# Patient Record
Sex: Female | Born: 1935
Health system: Southern US, Community
[De-identification: ages and names within clinical notes are randomized; demographics above are authoritative.]

---

## 2004-08-02 ENCOUNTER — Ambulatory Visit: Payer: Self-pay | Admitting: Psychiatry

## 2005-05-02 ENCOUNTER — Emergency Department: Payer: Self-pay | Admitting: General Practice

## 2009-07-13 ENCOUNTER — Emergency Department: Payer: Self-pay | Admitting: Internal Medicine

## 2010-05-05 ENCOUNTER — Emergency Department: Payer: Self-pay | Admitting: Emergency Medicine

## 2011-06-18 ENCOUNTER — Emergency Department: Payer: Self-pay | Admitting: Emergency Medicine

## 2011-11-01 LAB — URINALYSIS, COMPLETE
Bacteria: NONE SEEN
Glucose,UR: NEGATIVE mg/dL (ref 0–75)
Ph: 5 (ref 4.5–8.0)
RBC,UR: 4 /HPF (ref 0–5)
Squamous Epithelial: NONE SEEN

## 2011-11-01 LAB — CBC
HGB: 14.1 g/dL (ref 12.0–16.0)
MCH: 33.1 pg (ref 26.0–34.0)
MCHC: 33.7 g/dL (ref 32.0–36.0)
MCV: 98 fL (ref 80–100)
Platelet: 290 10*3/uL (ref 150–440)
RBC: 4.25 10*6/uL (ref 3.80–5.20)
RDW: 12.7 % (ref 11.5–14.5)

## 2011-11-01 LAB — COMPREHENSIVE METABOLIC PANEL
Albumin: 4.1 g/dL (ref 3.4–5.0)
Alkaline Phosphatase: 71 U/L (ref 50–136)
Anion Gap: 15 (ref 7–16)
BUN: 12 mg/dL (ref 7–18)
Calcium, Total: 8.9 mg/dL (ref 8.5–10.1)
Co2: 23 mmol/L (ref 21–32)
EGFR (African American): >60
Glucose: 105 mg/dL — ABNORMAL HIGH (ref 65–99)
Potassium: 3.6 mmol/L (ref 3.5–5.1)
SGOT(AST): 35 U/L (ref 15–37)
SGPT (ALT): 23 U/L
Total Protein: 8 g/dL (ref 6.4–8.2)

## 2011-11-01 LAB — TROPONIN I: Troponin-I: 1 ng/mL — ABNORMAL HIGH

## 2011-11-01 LAB — CK TOTAL AND CKMB (NOT AT ARMC)
CK, Total: 616 U/L — ABNORMAL HIGH (ref 21–215)
CK-MB: 6 ng/mL — ABNORMAL HIGH (ref 0.5–3.6)

## 2011-11-01 LAB — PHENYTOIN LEVEL, TOTAL: Dilantin: 3.7 ug/mL — ABNORMAL LOW (ref 10.0–20.0)

## 2011-11-02 ENCOUNTER — Inpatient Hospital Stay: Payer: Self-pay | Admitting: Internal Medicine

## 2011-11-02 ENCOUNTER — Ambulatory Visit: Payer: Self-pay | Admitting: Neurology

## 2011-11-02 DIAGNOSIS — I059 Rheumatic mitral valve disease, unspecified: Secondary | ICD-10-CM

## 2011-11-02 LAB — CK TOTAL AND CKMB (NOT AT ARMC)
CK, Total: 550 U/L — ABNORMAL HIGH (ref 21–215)
CK-MB: 3.9 ng/mL — ABNORMAL HIGH (ref 0.5–3.6)

## 2011-11-02 LAB — TROPONIN I: Troponin-I: 2 ng/mL — ABNORMAL HIGH

## 2011-11-03 LAB — CBC WITH DIFFERENTIAL/PLATELET
Basophil %: 0.5 %
Eosinophil #: 0.3 10*3/uL (ref 0.0–0.7)
Eosinophil %: 4.2 %
HCT: 36.5 % (ref 35.0–47.0)
HGB: 12 g/dL (ref 12.0–16.0)
Lymphocyte %: 32.4 %
MCH: 32.3 pg (ref 26.0–34.0)
MCHC: 32.9 g/dL (ref 32.0–36.0)
Monocyte #: 1 10*3/uL — ABNORMAL HIGH (ref 0.0–0.7)
Monocyte %: 13.2 %
Neutrophil #: 3.9 10*3/uL (ref 1.4–6.5)
Neutrophil %: 49.7 %
Platelet: 255 10*3/uL (ref 150–440)
WBC: 7.8 10*3/uL (ref 3.6–11.0)

## 2011-11-03 LAB — BASIC METABOLIC PANEL
Calcium, Total: 8.5 mg/dL (ref 8.5–10.1)
Chloride: 110 mmol/L — ABNORMAL HIGH (ref 98–107)
Creatinine: 0.64 mg/dL (ref 0.60–1.30)
EGFR (African American): >60
Glucose: 81 mg/dL (ref 65–99)
Osmolality: 285 (ref 275–301)
Potassium: 3.5 mmol/L (ref 3.5–5.1)
Sodium: 144 mmol/L (ref 136–145)

## 2011-11-03 LAB — LIPID PANEL
Cholesterol: 163 mg/dL (ref 0–200)
HDL Cholesterol: 65 mg/dL — ABNORMAL HIGH (ref 40–60)
Triglycerides: 87 mg/dL (ref 0–200)
VLDL Cholesterol, Calc: 17 mg/dL (ref 5–40)

## 2011-11-03 LAB — HEMOGLOBIN A1C: Hemoglobin A1C: 5.7 % (ref 4.2–6.3)

## 2011-11-04 LAB — URINALYSIS, COMPLETE
Bacteria: NONE SEEN
Bilirubin,UR: NEGATIVE
Glucose,UR: NEGATIVE mg/dL (ref 0–75)
Nitrite: NEGATIVE
Protein: NEGATIVE
RBC,UR: 1 /HPF (ref 0–5)
Specific Gravity: 1.008 (ref 1.003–1.030)
Squamous Epithelial: <1
WBC UR: 1 /HPF (ref 0–5)

## 2011-11-04 LAB — TROPONIN I: Troponin-I: 0.45 ng/mL — ABNORMAL HIGH

## 2011-11-06 LAB — CBC WITH DIFFERENTIAL/PLATELET
Basophil %: 0.5 %
Eosinophil %: 5.6 %
HCT: 36.7 % (ref 35.0–47.0)
HGB: 12.2 g/dL (ref 12.0–16.0)
Lymphocyte #: 2.6 10*3/uL (ref 1.0–3.6)
Lymphocyte %: 38.6 %
MCHC: 33.2 g/dL (ref 32.0–36.0)
MCV: 100 fL (ref 80–100)
Monocyte %: 13.8 %
Neutrophil #: 2.8 10*3/uL (ref 1.4–6.5)
RDW: 12.6 % (ref 11.5–14.5)
WBC: 6.8 10*3/uL (ref 3.6–11.0)

## 2011-11-06 LAB — BASIC METABOLIC PANEL
Calcium, Total: 8 mg/dL — ABNORMAL LOW (ref 8.5–10.1)
Chloride: 108 mmol/L — ABNORMAL HIGH (ref 98–107)
Creatinine: 0.59 mg/dL — ABNORMAL LOW (ref 0.60–1.30)
EGFR (Non-African Amer.): >60
Glucose: 81 mg/dL (ref 65–99)
Osmolality: 288 (ref 275–301)
Potassium: 3 mmol/L — ABNORMAL LOW (ref 3.5–5.1)
Sodium: 145 mmol/L (ref 136–145)

## 2012-04-16 ENCOUNTER — Ambulatory Visit: Payer: Self-pay | Admitting: Internal Medicine

## 2012-10-29 ENCOUNTER — Emergency Department: Payer: Self-pay | Admitting: Emergency Medicine

## 2013-05-20 ENCOUNTER — Ambulatory Visit: Payer: Self-pay | Admitting: Internal Medicine

## 2013-05-28 ENCOUNTER — Inpatient Hospital Stay: Payer: Self-pay | Admitting: Internal Medicine

## 2013-05-28 LAB — COMPREHENSIVE METABOLIC PANEL
Alkaline Phosphatase: 98 U/L (ref 50–136)
Anion Gap: 3 — ABNORMAL LOW (ref 7–16)
BUN: 12 mg/dL (ref 7–18)
Bilirubin,Total: 0.4 mg/dL (ref 0.2–1.0)
Co2: 32 mmol/L (ref 21–32)
Creatinine: 0.64 mg/dL (ref 0.60–1.30)
EGFR (Non-African Amer.): >60
Osmolality: 273 (ref 275–301)
Potassium: 3.7 mmol/L (ref 3.5–5.1)
SGOT(AST): 18 U/L (ref 15–37)
SGPT (ALT): 14 U/L (ref 12–78)
Total Protein: 7.8 g/dL (ref 6.4–8.2)

## 2013-05-28 LAB — URINALYSIS, COMPLETE
Bacteria: NONE SEEN
Glucose,UR: NEGATIVE mg/dL (ref 0–75)
Ketone: NEGATIVE
Ph: 6 (ref 4.5–8.0)
RBC,UR: 6 /HPF (ref 0–5)
Squamous Epithelial: 1
WBC UR: 3 /HPF (ref 0–5)

## 2013-05-28 LAB — CBC
HCT: 37.2 % (ref 35.0–47.0)
HGB: 13 g/dL (ref 12.0–16.0)
MCH: 32.6 pg (ref 26.0–34.0)
MCHC: 34.9 g/dL (ref 32.0–36.0)
MCV: 93 fL (ref 80–100)
Platelet: 298 10*3/uL (ref 150–440)
WBC: 9.1 10*3/uL (ref 3.6–11.0)

## 2013-05-28 LAB — PRO B NATRIURETIC PEPTIDE: B-Type Natriuretic Peptide: 1074 pg/mL — ABNORMAL HIGH (ref 0–450)

## 2013-05-28 LAB — CK TOTAL AND CKMB (NOT AT ARMC): CK-MB: <0.5 ng/mL — ABNORMAL LOW (ref 0.5–3.6)

## 2013-05-29 LAB — CBC WITH DIFFERENTIAL/PLATELET
Basophil #: 0 10*3/uL (ref 0.0–0.1)
Basophil %: 0.3 %
Eosinophil #: 0.2 10*3/uL (ref 0.0–0.7)
Eosinophil %: 1.4 %
HGB: 11.6 g/dL — ABNORMAL LOW (ref 12.0–16.0)
Lymphocyte #: 2.8 10*3/uL (ref 1.0–3.6)
Lymphocyte %: 18.7 %
MCH: 32.5 pg (ref 26.0–34.0)
Monocyte #: 1.7 x10 3/mm — ABNORMAL HIGH (ref 0.2–0.9)
Neutrophil %: 67.9 %
Platelet: 284 10*3/uL (ref 150–440)
WBC: 14.8 10*3/uL — ABNORMAL HIGH (ref 3.6–11.0)

## 2013-05-29 LAB — BASIC METABOLIC PANEL
Anion Gap: 4 — ABNORMAL LOW (ref 7–16)
BUN: 16 mg/dL (ref 7–18)
Calcium, Total: 8.6 mg/dL (ref 8.5–10.1)
Co2: 30 mmol/L (ref 21–32)
Creatinine: 0.64 mg/dL (ref 0.60–1.30)
EGFR (African American): >60
EGFR (Non-African Amer.): >60
Glucose: 91 mg/dL (ref 65–99)
Osmolality: 271 (ref 275–301)

## 2013-05-29 LAB — PROTIME-INR: Prothrombin Time: 15.7 secs — ABNORMAL HIGH (ref 11.5–14.7)

## 2013-05-29 LAB — MAGNESIUM: Magnesium: 1.6 mg/dL — ABNORMAL LOW

## 2013-06-02 LAB — CULTURE, BLOOD (SINGLE)

## 2013-06-04 ENCOUNTER — Inpatient Hospital Stay: Payer: Self-pay | Admitting: Internal Medicine

## 2013-06-04 LAB — CK TOTAL AND CKMB (NOT AT ARMC)
CK, Total: 74 U/L (ref 21–215)
CK-MB: 1.7 ng/mL (ref 0.5–3.6)

## 2013-06-04 LAB — CBC
HGB: 12 g/dL (ref 12.0–16.0)
MCH: 32.4 pg (ref 26.0–34.0)
MCHC: 34.8 g/dL (ref 32.0–36.0)
RDW: 12.6 % (ref 11.5–14.5)
WBC: 9.8 10*3/uL (ref 3.6–11.0)

## 2013-06-04 LAB — COMPREHENSIVE METABOLIC PANEL
Alkaline Phosphatase: 102 U/L (ref 50–136)
Anion Gap: 6 — ABNORMAL LOW (ref 7–16)
Bilirubin,Total: 0.2 mg/dL (ref 0.2–1.0)
Co2: 28 mmol/L (ref 21–32)
EGFR (African American): >60
Glucose: 85 mg/dL (ref 65–99)
Osmolality: 268 (ref 275–301)
Potassium: 3.9 mmol/L (ref 3.5–5.1)
SGPT (ALT): 18 U/L (ref 12–78)
Sodium: 134 mmol/L — ABNORMAL LOW (ref 136–145)
Total Protein: 7.6 g/dL (ref 6.4–8.2)

## 2013-06-04 LAB — PHENYTOIN LEVEL, TOTAL: Dilantin: 8.4 ug/mL — ABNORMAL LOW (ref 10.0–20.0)

## 2013-06-04 LAB — PRO B NATRIURETIC PEPTIDE: B-Type Natriuretic Peptide: 882 pg/mL — ABNORMAL HIGH (ref 0–450)

## 2013-06-04 LAB — TSH: Thyroid Stimulating Horm: 2.3 u[IU]/mL

## 2013-06-05 LAB — BASIC METABOLIC PANEL
Anion Gap: 5 — ABNORMAL LOW (ref 7–16)
BUN: 10 mg/dL (ref 7–18)
Chloride: 101 mmol/L (ref 98–107)
Creatinine: 0.72 mg/dL (ref 0.60–1.30)
EGFR (Non-African Amer.): >60
Glucose: 90 mg/dL (ref 65–99)
Osmolality: 269 (ref 275–301)
Potassium: 4 mmol/L (ref 3.5–5.1)
Sodium: 135 mmol/L — ABNORMAL LOW (ref 136–145)

## 2013-06-05 LAB — CBC WITH DIFFERENTIAL/PLATELET
Basophil %: 0.5 %
Eosinophil %: 0.5 %
HCT: 32.8 % — ABNORMAL LOW (ref 35.0–47.0)
HGB: 11.3 g/dL — ABNORMAL LOW (ref 12.0–16.0)
Lymphocyte #: 1.6 10*3/uL (ref 1.0–3.6)
MCH: 31.8 pg (ref 26.0–34.0)
Neutrophil #: 5.5 10*3/uL (ref 1.4–6.5)
Neutrophil %: 71.3 %
Platelet: 385 10*3/uL (ref 150–440)
RBC: 3.54 10*6/uL — ABNORMAL LOW (ref 3.80–5.20)
RDW: 12.6 % (ref 11.5–14.5)

## 2013-06-09 LAB — CULTURE, BLOOD (SINGLE)

## 2013-06-20 ENCOUNTER — Ambulatory Visit: Payer: Self-pay | Admitting: Internal Medicine

## 2013-11-24 ENCOUNTER — Inpatient Hospital Stay: Payer: Self-pay | Admitting: Internal Medicine

## 2013-11-24 LAB — URINALYSIS, COMPLETE
BILIRUBIN, UR: NEGATIVE
Bacteria: NONE SEEN
Glucose,UR: NEGATIVE mg/dL (ref 0–75)
Ketone: NEGATIVE
LEUKOCYTE ESTERASE: NEGATIVE
Nitrite: NEGATIVE
PH: 7 (ref 4.5–8.0)
Protein: NEGATIVE
RBC,UR: 2 /HPF (ref 0–5)
Specific Gravity: 1.013 (ref 1.003–1.030)
Squamous Epithelial: <1
WBC UR: NONE SEEN /HPF (ref 0–5)

## 2013-11-24 LAB — COMPREHENSIVE METABOLIC PANEL
ALBUMIN: 3 g/dL — AB (ref 3.4–5.0)
ANION GAP: 8 (ref 7–16)
Alkaline Phosphatase: 77 U/L
BUN: 11 mg/dL (ref 7–18)
Bilirubin,Total: 0.9 mg/dL (ref 0.2–1.0)
CALCIUM: 8.5 mg/dL (ref 8.5–10.1)
CHLORIDE: 101 mmol/L (ref 98–107)
CO2: 21 mmol/L (ref 21–32)
Creatinine: 0.78 mg/dL (ref 0.60–1.30)
EGFR (African American): >60
GLUCOSE: 83 mg/dL (ref 65–99)
OSMOLALITY: 259 (ref 275–301)
Potassium: 3.7 mmol/L (ref 3.5–5.1)
SGOT(AST): 23 U/L (ref 15–37)
SGPT (ALT): 11 U/L — ABNORMAL LOW (ref 12–78)
Sodium: 130 mmol/L — ABNORMAL LOW (ref 136–145)
Total Protein: 7.5 g/dL (ref 6.4–8.2)

## 2013-11-24 LAB — TROPONIN I: Troponin-I: <0.02 ng/mL

## 2013-11-24 LAB — CBC
HCT: 41.4 % (ref 35.0–47.0)
HGB: 13.5 g/dL (ref 12.0–16.0)
MCH: 31.2 pg (ref 26.0–34.0)
MCHC: 32.7 g/dL (ref 32.0–36.0)
MCV: 96 fL (ref 80–100)
Platelet: 276 10*3/uL (ref 150–440)
RBC: 4.33 10*6/uL (ref 3.80–5.20)
RDW: 13.6 % (ref 11.5–14.5)
WBC: 9.3 10*3/uL (ref 3.6–11.0)

## 2013-11-25 ENCOUNTER — Ambulatory Visit: Payer: Self-pay | Admitting: Internal Medicine

## 2013-11-25 LAB — CBC WITH DIFFERENTIAL/PLATELET
BASOS ABS: 0 10*3/uL (ref 0.0–0.1)
Basophil %: 0.1 %
Eosinophil #: 0 10*3/uL (ref 0.0–0.7)
Eosinophil %: 0 %
HCT: 35.3 % (ref 35.0–47.0)
HGB: 11.8 g/dL — ABNORMAL LOW (ref 12.0–16.0)
LYMPHS ABS: 0.9 10*3/uL — AB (ref 1.0–3.6)
Lymphocyte %: 4.6 %
MCH: 32.2 pg (ref 26.0–34.0)
MCHC: 33.5 g/dL (ref 32.0–36.0)
MCV: 96 fL (ref 80–100)
MONOS PCT: 5.1 %
Monocyte #: 1 x10 3/mm — ABNORMAL HIGH (ref 0.2–0.9)
NEUTROS PCT: 90.2 %
Neutrophil #: 17 10*3/uL — ABNORMAL HIGH (ref 1.4–6.5)
Platelet: 265 10*3/uL (ref 150–440)
RBC: 3.67 10*6/uL — AB (ref 3.80–5.20)
RDW: 13.5 % (ref 11.5–14.5)
WBC: 18.9 10*3/uL — ABNORMAL HIGH (ref 3.6–11.0)

## 2013-11-25 LAB — COMPREHENSIVE METABOLIC PANEL
ALBUMIN: 2.4 g/dL — AB (ref 3.4–5.0)
AST: 46 U/L — AB (ref 15–37)
Alkaline Phosphatase: 57 U/L
Anion Gap: 6 — ABNORMAL LOW (ref 7–16)
BUN: 10 mg/dL (ref 7–18)
Bilirubin,Total: 0.9 mg/dL (ref 0.2–1.0)
CALCIUM: 7.7 mg/dL — AB (ref 8.5–10.1)
CREATININE: 0.79 mg/dL (ref 0.60–1.30)
Chloride: 109 mmol/L — ABNORMAL HIGH (ref 98–107)
Co2: 25 mmol/L (ref 21–32)
EGFR (Non-African Amer.): >60
Glucose: 72 mg/dL (ref 65–99)
OSMOLALITY: 277 (ref 275–301)
POTASSIUM: 4 mmol/L (ref 3.5–5.1)
SGPT (ALT): 14 U/L (ref 12–78)
Sodium: 140 mmol/L (ref 136–145)
Total Protein: 6.1 g/dL — ABNORMAL LOW (ref 6.4–8.2)

## 2013-11-25 LAB — MAGNESIUM: MAGNESIUM: 1.7 mg/dL — AB

## 2013-11-25 LAB — TSH: THYROID STIMULATING HORM: 0.063 u[IU]/mL — AB

## 2013-11-25 LAB — T4, FREE: Free Thyroxine: 1.37 ng/dL (ref 0.76–1.46)

## 2013-11-25 LAB — PHENYTOIN LEVEL, TOTAL: DILANTIN: 1.5 ug/mL — AB (ref 10.0–20.0)

## 2013-11-26 LAB — CBC WITH DIFFERENTIAL/PLATELET
BASOS ABS: 0.1 10*3/uL (ref 0.0–0.1)
Basophil %: 0.3 %
Eosinophil #: 0 10*3/uL (ref 0.0–0.7)
Eosinophil %: 0 %
HCT: 35.8 % (ref 35.0–47.0)
HGB: 12 g/dL (ref 12.0–16.0)
LYMPHS ABS: 0.6 10*3/uL — AB (ref 1.0–3.6)
LYMPHS PCT: 2.6 %
MCH: 32.3 pg (ref 26.0–34.0)
MCHC: 33.5 g/dL (ref 32.0–36.0)
MCV: 96 fL (ref 80–100)
MONOS PCT: 2.9 %
Monocyte #: 0.7 x10 3/mm (ref 0.2–0.9)
NEUTROS ABS: 23.8 10*3/uL — AB (ref 1.4–6.5)
Neutrophil %: 94.2 %
Platelet: 293 10*3/uL (ref 150–440)
RBC: 3.72 10*6/uL — AB (ref 3.80–5.20)
RDW: 13.8 % (ref 11.5–14.5)
WBC: 25.3 10*3/uL — AB (ref 3.6–11.0)

## 2013-11-26 LAB — BASIC METABOLIC PANEL
Anion Gap: 4 — ABNORMAL LOW (ref 7–16)
BUN: 14 mg/dL (ref 7–18)
CALCIUM: 8 mg/dL — AB (ref 8.5–10.1)
CHLORIDE: 110 mmol/L — AB (ref 98–107)
CREATININE: 0.87 mg/dL (ref 0.60–1.30)
Co2: 25 mmol/L (ref 21–32)
GFR CALC NON AF AMER: 58 — AB
Glucose: 144 mg/dL — ABNORMAL HIGH (ref 65–99)
Osmolality: 281 (ref 275–301)
POTASSIUM: 3.6 mmol/L (ref 3.5–5.1)
Sodium: 139 mmol/L (ref 136–145)

## 2013-11-26 LAB — URINE CULTURE

## 2013-11-27 LAB — CBC WITH DIFFERENTIAL/PLATELET
BASOS ABS: 0.1 10*3/uL (ref 0.0–0.1)
BASOS PCT: 0.3 %
EOS ABS: 0 10*3/uL (ref 0.0–0.7)
Eosinophil %: 0 %
HCT: 36.7 % (ref 35.0–47.0)
HGB: 11.9 g/dL — AB (ref 12.0–16.0)
LYMPHS PCT: 3 %
Lymphocyte #: 0.5 10*3/uL — ABNORMAL LOW (ref 1.0–3.6)
MCH: 31.1 pg (ref 26.0–34.0)
MCHC: 32.5 g/dL (ref 32.0–36.0)
MCV: 96 fL (ref 80–100)
MONO ABS: 0.6 x10 3/mm (ref 0.2–0.9)
Monocyte %: 3.4 %
NEUTROS PCT: 93.3 %
Neutrophil #: 16.8 10*3/uL — ABNORMAL HIGH (ref 1.4–6.5)
Platelet: 291 10*3/uL (ref 150–440)
RBC: 3.83 10*6/uL (ref 3.80–5.20)
RDW: 14.3 % (ref 11.5–14.5)
WBC: 18 10*3/uL — ABNORMAL HIGH (ref 3.6–11.0)

## 2013-11-27 LAB — COMPREHENSIVE METABOLIC PANEL
Albumin: 2 g/dL — ABNORMAL LOW (ref 3.4–5.0)
Alkaline Phosphatase: 61 U/L
Anion Gap: 6 — ABNORMAL LOW (ref 7–16)
BUN: 16 mg/dL (ref 7–18)
Bilirubin,Total: 0.3 mg/dL (ref 0.2–1.0)
CHLORIDE: 113 mmol/L — AB (ref 98–107)
Calcium, Total: 8 mg/dL — ABNORMAL LOW (ref 8.5–10.1)
Co2: 24 mmol/L (ref 21–32)
Creatinine: 0.84 mg/dL (ref 0.60–1.30)
EGFR (Non-African Amer.): >60
Glucose: 94 mg/dL (ref 65–99)
Osmolality: 286 (ref 275–301)
Potassium: 3.6 mmol/L (ref 3.5–5.1)
SGOT(AST): 24 U/L (ref 15–37)
SGPT (ALT): 10 U/L — ABNORMAL LOW (ref 12–78)
Sodium: 143 mmol/L (ref 136–145)
Total Protein: 6.2 g/dL — ABNORMAL LOW (ref 6.4–8.2)

## 2013-11-28 LAB — BASIC METABOLIC PANEL
Anion Gap: 9 (ref 7–16)
BUN: 21 mg/dL — AB (ref 7–18)
CHLORIDE: 115 mmol/L — AB (ref 98–107)
Calcium, Total: 8.6 mg/dL (ref 8.5–10.1)
Co2: 22 mmol/L (ref 21–32)
Creatinine: 0.68 mg/dL (ref 0.60–1.30)
EGFR (African American): >60
EGFR (Non-African Amer.): >60
Glucose: 69 mg/dL (ref 65–99)
Osmolality: 292 (ref 275–301)
Potassium: 3.7 mmol/L (ref 3.5–5.1)
Sodium: 146 mmol/L — ABNORMAL HIGH (ref 136–145)

## 2013-11-28 LAB — CBC WITH DIFFERENTIAL/PLATELET
BASOS PCT: 0.2 %
Basophil #: 0 10*3/uL (ref 0.0–0.1)
Eosinophil #: 0 10*3/uL (ref 0.0–0.7)
Eosinophil %: 0.1 %
HCT: 34.8 % — ABNORMAL LOW (ref 35.0–47.0)
HGB: 11.2 g/dL — ABNORMAL LOW (ref 12.0–16.0)
Lymphocyte #: 0.8 10*3/uL — ABNORMAL LOW (ref 1.0–3.6)
Lymphocyte %: 6.1 %
MCH: 30.9 pg (ref 26.0–34.0)
MCHC: 32.1 g/dL (ref 32.0–36.0)
MCV: 96 fL (ref 80–100)
MONO ABS: 0.7 x10 3/mm (ref 0.2–0.9)
MONOS PCT: 5.3 %
NEUTROS PCT: 88.3 %
Neutrophil #: 11.7 10*3/uL — ABNORMAL HIGH (ref 1.4–6.5)
PLATELETS: 286 10*3/uL (ref 150–440)
RBC: 3.62 10*6/uL — ABNORMAL LOW (ref 3.80–5.20)
RDW: 14.3 % (ref 11.5–14.5)
WBC: 13.2 10*3/uL — ABNORMAL HIGH (ref 3.6–11.0)

## 2013-11-29 LAB — CULTURE, BLOOD (SINGLE)

## 2013-12-18 ENCOUNTER — Ambulatory Visit: Payer: Self-pay | Admitting: Internal Medicine

## 2013-12-18 DEATH — deceased

## 2014-10-05 IMAGING — CR DG ABDOMEN 1V
1 series · 1 of 1 positions shown · non-contrast
Comparison: CT ANGIO ABD-PELV WO/W CM dated 11/24/2013; DG PELVIS
1-2V dated 10/29/2012

CLINICAL DATA: Abdominal distention concern of an ileus

EXAM:
ABDOMEN - 1 VIEW

[supine kub]
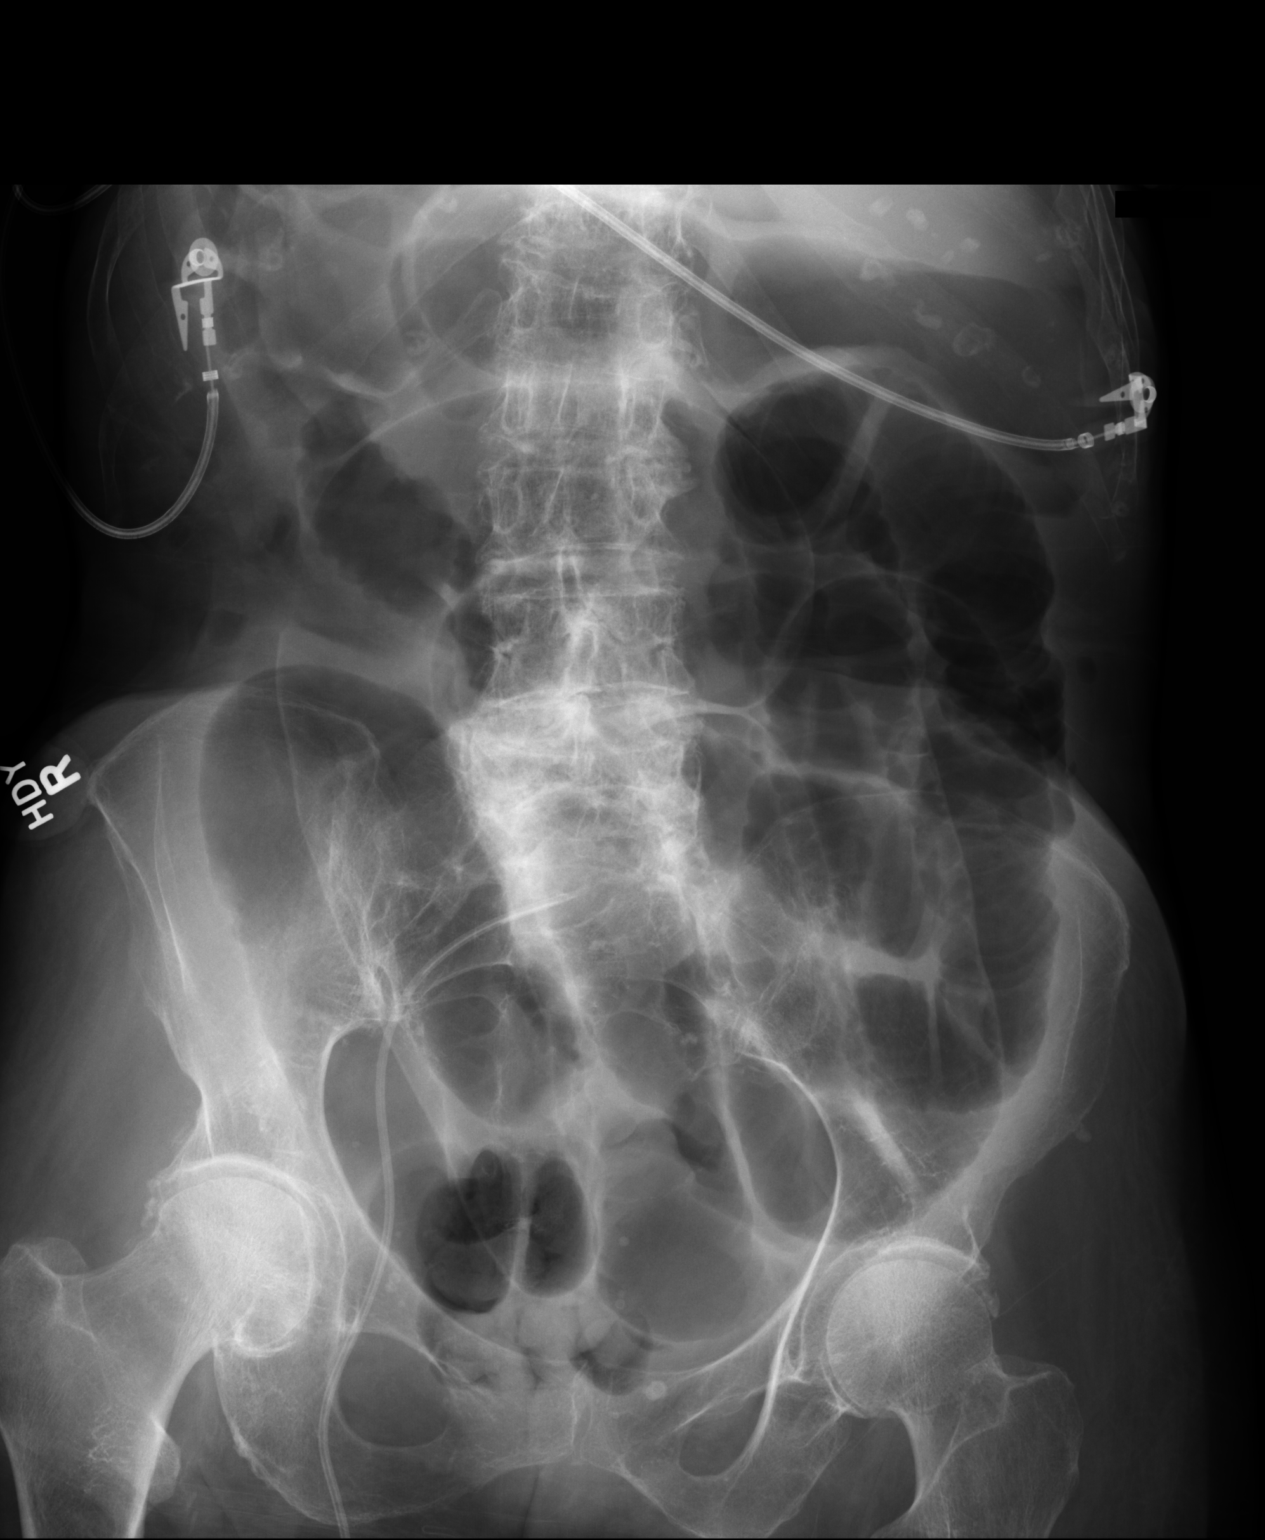

[1 of 1 positions shown; findings below may reference images not displayed]

FINDINGS: Air is seen. Distended loops of large small bowel. There findings
concerning for the presence of a Riggler's sign within loops of
small bowel in the central abdomen and pelvis. Further evaluation
with and upright abdomen view or lateral decubitus is recommended.
Multilevel degenerative changes appreciated within lumbar spine.
Degenerative changes appreciated within the right left hips. A
catheter projects within the lower pelvis extending to the lower
central abdomen likely representing a femoral vein central venous
catheter.
IMPRESSION: Findings in the central and lower abdomen raising concern of
possible free air. Further evaluation with upright imaging and/or
left lateral decubitus view is recommended as well as clinical
correlation. Distended air filled loops of large and small bowel
which may reflect an ileus. These results were called by telephone
at the time of interpretation on 11/26/2013 at [DATE] to Dr.
TAT TIGER , who verbally acknowledged these results.

## 2015-02-09 NOTE — Discharge Summary (Signed)
PATIENT NAME:  Michelle Kidd, Michelle Kidd MR#:  [id number] DATE OF BIRTH:  [birth year]  DATE OF ADMISSION:  05/28/2013 DATE OF DISCHARGE:  05/30/2013   DISCHARGE DIAGNOSES: 1.  Active on chronic respiratory failure secondary to pneumonia.  2.  Chronic obstructive pulmonary disease exacerbation. 3.  Seizures.  4.  Acute on chronic diastolic heart failure.   DISCHARGE MEDICATIONS: 1.  Metoprolol 25 mg half tablet by mouth twice daily.  2.  Levothyroxine  112mcg by mouth daily in the morning.  3.  Furosemide 20 mg by mouth daily.  4.  Tramadol 50 mg by mouth every 4 to 6 hours as needed for pain.  5.  KCl 10 mEq by mouth daily.  6.  Phenytoin 200 mg by mouth every 12 hours.  7.  Amoxicillin 500 mg by mouth 3 times daily.  8.  Azithromycin, a 3 day course, 500 mg by mouth daily.  9.  Amoxicillin is given for 10 days for pneumonia.   OXYGEN:  2 liters of oxygen all the time.   DIET:  Low sodium diet.   CONSULTATIONS:  None.   HOSPITAL COURSE:  A 81 year old female patient with history of COPD, seizure disorder, history of hypothyroidism, hypertension came in because of shortness of breath, cough and productive phlegm.  The patient uses 2 to 3 liters of oxygen, but in the ER the patient had to be started on 4.5 liters of oxygen.  The patient desaturated when she was speaking and the patient admitted to hospitalist service for COPD flare and the patient was started on Rocephin and Zithromax and chest x-ray on admission showed suggestive of acute interstitial infiltrate or edema.  The patient does not have any pedal edema.  Initial white count was 9.1.  The following day the white count went up to 14.8.  The patient's blood cultures have been negative so we treated her for pneumonia because of her symptoms and the patient also says she has some hot and cold feeling at home, so we gave her Rocephin and Zithromax initially and sent her home with Amoxicillin and Zithromax.  The patient was started on Lasix  for possible CHF, but had an EF of 60% to 65% and normal LV function so we resumed the home dose of Lasix only.   The patient did not want to stay home or seen by physical therapy.  She wanted to go home and the patient lives [facility].  She does have home oxygen and she is followed by hospice and she did not express any needs that she really wanted to go home.   Time spent on discharge preparation more than 30 minutes.  Oxygen saturation at the time of discharge on 2 liters 92%.  Other vitals are stable.    ____________________________ Katha HammingSnehalatha Millianna Szymborski, MD sk:ea D: 06/01/2013 16:15:04 ET T: 06/02/2013 00:36:19 ET JOB#: [id number]  cc: Katha HammingSnehalatha Jiali Linney, MD, <Dictator> Katha HammingSNEHALATHA Eunice Oldaker MD ELECTRONICALLY SIGNED 06/07/2013 16:55

## 2015-02-09 NOTE — Discharge Summary (Signed)
PATIENT NAME:  Michelle Kidd, Michelle Kidd MR#:  [id number] DATE OF BIRTH:  01/17/1920  DATE OF ADMISSION:  06/04/2013 DATE OF DISCHARGE:  06/06/2013  DISCHARGE DIAGNOSES: 1.  Acute chronic obstructive pulmonary disease exacerbation.  2.  Chronic respiratory failure  3.  Chronic diastolic congestive heart failure.   ADMITTING HISTORY AND PHYSICAL: Please see detailed H and P dictated previously by Dr. Allena KatzPatel. In brief, a 81 year old Caucasian female patient with history of chronic respiratory failure, chronic obstructive pulmonary disease return to the Emergency Room complaining of worsening shortness of breath. The patient was found to have wheezing with chronic obstructive pulmonary disease exacerbation and admitted to the hospitalist service.   HOSPITAL COURSE:   Chronic obstructive pulmonary disease exacerbation. The patient did have increased requirements of oxygen up to 5 liters initially. She had acute wheezing, started on nebulizers, steroids and antibiotics, with which she improved well during the next 48 hours of hospital stay. The patient's wheezing, has decreased. She is back to 2 liters of oxygen which is her baseline, has requested to be discharged home. I have discussed with the case manager and home health has been set up, as the patient is homebound. The patient is followed by hospice at home.   On the day of discharge, the patient does not have any wheezing on exam. She is on 2 liters oxygen.   DISCHARGE MEDICATIONS: Include:  1.  Metoprolol tartrate 25 mg half tablet oral 2 times a day.  2.  Levothyroxine mcg oral once a day.  3.  Lasix 20 mg oral once a day.  4.  Tramadol 50 mg oral every six hours as needed for pain.  5.  Potassium chloride 10 mEq oral once a day.  6.  Phenytoin 200 mg oral 2 times a day.  7.  DuoNeb 3ml  inhaled every four hours as needed.  8.  Prednisone 60 mg tapered over six days.  9.  Levaquin 250 mg oral once a day.  10.  Advair Diskus 150, one puff inhaled  2 times a day.  11.  Ativan 0.5 mg oral once a day as needed for anxiety.   DISCHARGE INSTRUCTIONS: Regular diet of regular consistency. Activity as tolerated with assistance. Home health has been set up with a nurse and nurse's aide. Follow up with primary care physician in 1 to 2 weeks.   TIME SPENT ON DAY OF DISCHARGE IN DISCHARGE ACTIVITY:  35 minutes.  ____________________________ Molinda BailiffSrikar R. Renay Crammer, MD srs:cc D: 06/07/2013 15:32:50 ET T: 06/07/2013 20:47:41 ET JOB#: [id number]  cc: Wardell HeathSrikar R. Gari Trovato, MD, <Dictator> Reola MosherAndrew S. Randa LynnLamb, MD Orie FishermanSRIKAR R Toria Monte MD ELECTRONICALLY SIGNED 06/10/2013 0:45

## 2015-02-09 NOTE — H&P (Signed)
PATIENT NAME:  Michelle Kidd, Michelle Kidd MR#:  [id number] DATE OF BIRTH:  [birth year]  DATE OF ADMISSION:  06/04/2013  PRIMARY CARE PHYSICIAN:  Dr. Mickey Farberavid Thies  ED REFERRING PHYSICIAN:  Dr. Enedina FinnerGoli    CHIEF COMPLAINT: Shortness of breath.   HISTORY OF PRESENT ILLNESS: The patient is a 81 year old female who is a resident of a [facility] independent living facility, who was actually hospitalized on 08/09 and was discharged three days later, who presents back with shortness of breath. At that time, she presented with shortness of breath. She does have chronic respiratory failure and she is chronically 3.5 liters of oxygen at home. She was admitted with possible pneumonia COPD flare. At that time, she also had echo which showed a normal EF and did have some findings consistent with pulmonary hypertension. The patient was discharged with oral antibiotics, comes back with worsening shortness of breath. Noted to be hypoxic. Her chest x-ray shows chronic fibrotic changes which are unchanged. The patient denies any fevers. She has been having cough which is chronic. She also complains of chest pain with the coughing. She denies any fevers or chills. No abdominal pain, nausea, vomiting or diarrhea. The patient is a poor historian.   PAST MEDICAL HISTORY: 1.  Chronic respiratory failure requiring chronic O2.  2.  Chronic obstructive pulmonary disease.  3.  History of seizure disorder.  4.  History of hypertension.  5.  Hypothyroidism.   PAST SURGICAL HISTORY: None.   ALLERGIES: None.   MEDICATIONS: From the recent discharge: Metoprolol 25 1/2 tab p.o. b.i.d., levothyroxine 112 mcg daily, Lasix 20 daily, tramadol 50 q.4 to 6 p.r.n. for pain, KCl 10 mEq daily,  phenytoin 200 q.12, amoxicillin 500 mg 1 tab p.o. t.i.d., azithromycin three day course.   ALLERGIES: None.   SOCIAL HISTORY: Resides at [facility].  Negative for tobacco or alcohol or illicit drug. The patient is DNR, wheelchair-bound.    FAMILY HISTORY: Significant for seizure disorder.   REVIEW OF SYSTEMS: CONSTITUTIONAL: Denies fevers. Complains of fatigue, weakness. No pain. No weight loss. No weight gain.  EYES: No blurred or double vision.  ENT: No tinnitus. No ear pain. Does have some hearing loss. No difficulty swallowing.  RESPIRATORY: Complains of cough. No wheezing. No hemoptysis. Has chronic dyspnea. Has COPD. CARDIOVASCULAR: Complains of chest pain with deep breathing. No orthopnea. No edema. No arrhythmia.  GASTROINTESTINAL: No nausea, vomiting, diarrhea. No abdominal pain. No hematemesis. No melena.  GENITOURINARY: Denies any dysuria, hematuria, renal calculus or frequency.  ENDOCRINE: Denies any polyuria or nocturia. Does have hypothyroidism.  HEMATOLOGIC/LYMPHATIC:  Denies any major bruisability or bleeding.  SKIN: No acne. No rash. No changes in mole, hair or skin.  MUSCULOSKELETAL: Denies any pain in the neck, back or shoulder.  NEUROLOGIC: No numbness. No CVA. No TIA. No seizures.  PSYCHIATRIC: No anxiety. No insomnia. No ADD.   PHYSICAL EXAMINATION: VITAL SIGNS: Temperature 98.4, pulse 64, respirations 30, blood pressure 139/55, 02 94%.  GENERAL: The patient is a very frail-looking Caucasian female, is dyspneic.  EYES: Pupils equally round, reactive to light and accommodation. There is no conjunctival pallor. No scleral icterus. Extraocular movements intact.  HEENT: Head normocephalic, atraumatic. Nose: There is no nasal lesions, no drainage. Ears: There is no drainage or external lesions. Mouth: No exudates, no lesions. NECK: Supple and symmetric. No masses. Thyroid midline. No JVD.  RESPIRATORY: She has some accessory muscle usage with bilateral crackles and rhonchi throughout both lungs.  CARDIOVASCULAR: Regular rate and rhythm. No  murmurs, gallops, clicks or heaves.  ABDOMEN: Soft, nontender, nondistended. Positive bowel sounds x 4.   GENITOURINARY: Deferred.  MUSCULOSKELETAL: There is no  erythema or swelling.  SKIN: No rash.  LYMPHATICS: No lymph nodes palpable. VASCULAR: Good DP, PT pulses.  NEUROLOGICAL: Cranial nerves II through XII grossly intact. Strength 5 out of 5 in all 4 extremities.  PSYCHIATRIC: A little anxious from her breathing difficulties.   EVALUATIONS: Chest x-ray shows bilateral interstitial changes likely representing chronic interstitial lung disease. Her blood work is currently pending.   ASSESSMENT AND PLAN: The patient is 81 year old white female with history of chronic lung disease, likely pulmonary fibrosis and chronic obstructive pulmonary disease represents   with shortness of breath.   1.  Acute on chronic respiratory failure: Likely due to possible chronic obstructive pulmonary disease flare, possible progressive interstitial lung disease. The patient currently does not appear volume overloaded. Had a recent echo with pulmonary hypertension with preserved ejection fraction noted during recent hospitalization. At this time, we will treat her with nebulizers, steroids and IV antibiotics. Her prognosis is very poor in light of progressive lung disease. I will ask palliative care team to come assist the patient. The patient may benefit from possible hospice services.  2.  Hypothyroidism. Continue Synthroid.  3.  History of seizure disorder, and there was some question whether the patient may have had a seizure. I will check  Dilantin level. We will continue Dilantin as taking at home.  4.  CODE STATUS: DO NOT RESUSCITATE. As stated above, we will get palliative care team to come evaluate the patient   Note:  50 minutes spent on this patient.    ____________________________ Lacie Scotts. Allena Katz, MD shp:nts D: 06/04/2013 20:45:02 ET T: 06/04/2013 22:44:25 ET JOB#: [id number]  cc: Elis Sauber H. Allena Katz, MD, <Dictator> Charise Carwin MD ELECTRONICALLY SIGNED 06/06/2013 16:27

## 2015-02-09 NOTE — H&P (Signed)
PATIENT NAME:  Michelle Kidd, Tawnie M MR#:  [id number] DATE OF BIRTH:  01-08-1920  DATE OF ADMISSION:  05/28/2013  PRIMARY CARE PHYSICIAN: ?.   CODE STATUS: DNR.   CHIEF COMPLAINT: Shortness of breath and weakness.   HISTORY OF PRESENT ILLNESS: This is a 81 year old female, a resident of [facility] independent living facility who was sent in because she has been feeling weak and shortness of breath. Per patient, she has not been eating for awhile, probably around a little over a month. She has been getting progressively weak. She feels as though she "can't go on any more." She has chronic respiratory failure, on 3.5 liters of oxygen. Here in the ER, she is on 4.5 liters of oxygen. She desaturates when she speaks. The patient states she has been coughing, which has been productive of sputum, darkish in color, which she thought was her sinuses. She does not report any significant wheezing. There is some report of some diarrhea. No evidence of bleeding. No evidence of blood in her productive sputum. She states she has had chills, but no significant fever. She was eventually just sent in to the ER. History provided by the patient and her caretaker. The patient is a poor to fair historian. She does appear weak.   REVIEW OF SYSTEMS: As best able to, all 10-point systems reviewed and negative except as noted in HPI.   PAST MEDICAL HISTORY: Includes chronic respiratory failure, COPD, seizure disorder,  history of hypertension and hypothyroidism.    PAST SURGICAL HISTORY: None.   MEDICATIONS: 1. Lasix 20 mg daily.  2. Synthroid 0.112 mg daily.  3. Metoprolol 12.5 mg p.o. b.i.d.  4. Dilantin 200 mg p.o. q.12 hours.  5. Tramadol 50 mg q.6 hours p.r.n.  ALLERGIES: No known drug allergies.   SOCIAL HISTORY: Resides at [facility]. Negative tobacco, alcohol or illicit drugs. The patient is on home oxygen. She is DNR. She is mostly wheelchair-bound, but she does do some limited ambulation with a  4-prong push walker.   FAMILY HISTORY: Significant for epilepsy.   PHYSICAL EXAMINATION:  VITAL SIGNS: Blood pressure 126/62, pulse 66, respirations 22, temperature 97.8, satting 99% on 4 liters nasal cannula.  GENERAL: Alert and oriented female, weak.  EYES: Pink conjunctivae. PERRLA.  ENT: Somewhat dry oral mucosa. Trachea midline.  NECK: Supple.  CARDIOVASCULAR: Regular rate and rhythm without murmurs, regurgitation or gallops. No JVD.  LUNGS: No wheeze appreciated. Poor air exchange. Shallow respirations. No use of accessory muscles.  ABDOMEN: Soft, positive bowel sounds. Nontender, nondistended. No organomegaly.  NEUROLOGIC: Cranial nerves II through XII grossly intact. Sensation intact.  MUSCULOSKELETAL: The patient is globally weakened by age and debility, chronic. Strength to approximately 4 to 5 in bilateral lower extremities, 5 out of 5 in upper extremities.  SKIN: No rashes. No subcutaneous crepitation.   LABORATORY DATA: UA. Nitrite negative, leukocyte esterase trace, white blood cells 3 and bacteria not seen. Sodium 137, potassium 3.7, chloride 102, CO2 32, BUN 12, creatinine 0.6, glucose 86, calcium 9.1. White blood count 9.1, hemoglobin 13, platelets 298.   IMAGING: Chest x-ray: Findings suggest acute interstitial infiltrate or edema superimposed on chronic fibrotic changes in both lungs. There is no alveolar pneumonia or significant pleural fluid collection.   ASSESSMENT AND PLAN:  1. Shortness of breath.  2. Chronic respiratory failure. I suspect the patient has some mild fluid overload. At this point, I am not highly suspicious of pneumonia. I will go ahead and order a BNP on the patient  and order some IV Lasix for the patient. The patient did receive antibiotics in the ER. Blood cultures have additionally been ordered. Given her history of chronic respiratory failure and chronic obstructive pulmonary disease, I will go ahead and continue the antibiotics until BNP is resulted  for additional information. I will also order a repeat chest x-ray for the a.m. Continue the patient's home oxygen. Nebulizers have been ordered as needed with respiratory to assess. Oxygen to keep saturations greater than 88% has been ordered.  3. History of seizure disorder.  4. Hypertension. Continue home medications.  5. I will request physical therapy to see the patient.  6. Tessalon Perles p.r.n. cough.   Based on the patient's symptoms, the patient may likely need to be admitted greater than 48 hours.  ____________________________ Gery Pray, MD dc:OSi D: 05/28/2013 13:42:23 ET T: 05/28/2013 13:59:54 ET JOB#: [id number]  cc: Gery Pray, MD, <Dictator> Gery Pray MD ELECTRONICALLY SIGNED 05/29/2013 17:47

## 2015-02-10 NOTE — H&P (Signed)
PATIENT NAME:  Michelle Kidd, Michelle Kidd MR#:  [id number] DATE OF BIRTH:  [birth year]  DATE OF ADMISSION:  11/24/2013  REFERRING PHYSICIAN: Lowella FairyJohn Woodruff, MD   PRIMARY CARE PHYSICIAN: Alonna BucklerAndrew Lamb, MD  CHIEF COMPLAINT: Abdominal pain.   HISTORY OF PRESENT ILLNESS: This is a very nice 81 year old female, who is a poor historian, who comes today with a history of abdominal pain that has been going on for a week. The patient states that she has not been eating well. She is having regular bowel movements and she is urinating good amounts but she has been feeling weak, dizzy, and lightheaded.   The patient lives by herself. She has a live-in relative, which is apparently a nephew, which we have not been able to contact. She came via EMS and actually she started to have diarrhea this morning, but prior to today, she was having regular bowel movements.   At this time, she states the abdominal pain is really bad. She has been having some pain radiating into the left lower back and now it is all over her belly. She is at home with 2 liters of oxygen due to her chronic respiratory failure.   CODE STATUS: LIMITED CODE. SHE WOULD LIKE TO HAVE PRESSORS BUT SHE WOULD LIKE TO BE INTUBATED OR TO HAVE ANY RESUSCITATION IF HER HEART STOPS.   The patient is admitted for treatment of peritonitis as the CT scan shows significant edema of the pretibial space, and on top of that, her blood pressure initially was 60s over 50s and gets as low as 37/29, significantly tachycardic although she is not febrile, and her white count is normal.   Surgery has seen the patient. She is not a surgical candidate at this moment. They recommended medical treatment.   REVIEW OF SYSTEMS: A 12-system review of systems is done. Difficult to obtain due to the patient's difficulty communicating.  CONSTITUTIONAL: She states she has been fatigued, weak. No fevers.  EYES: No blurry vision, double vision.  ENT: No difficulty swallowing. No tinnitus.   RESPIRATORY: Chronic shortness of breath, chronic oxygen-dependent, chronic cough. No sputum.  CARDIOVASCULAR: No chest pain, orthopnea, or syncope.  GASTROINTESTINAL: No nausea or vomiting. Positive abdominal pain. Positive diarrhea today but prior to today, she has been having normal bowel movements. No hematuria or melena.  GENITOURINARY: No dysuria or hematuria.  ENDOCRINE: No polyuria, polydipsia, polyphagia.  SKIN: No rashes or petechiae. The patient looks very pale.  MUSCULOSKELETAL: No significant joint pain. No gout.  NEUROLOGIC: No numbness, tingling. No CVAs.  PSYCHIATRIC: No agitation. The patient is alert. She is oriented only to person and place but not time.   PAST MEDICAL HISTORY:  1.  Chronic respiratory failure, 2 L of oxygen at home.  2.  COPD.  3.  History of seizure disorder.  4.  Hypertension.  5.  Hypothyroidism.   PAST SURGICAL HISTORY: None.   ALLERGIES: No known drug allergies.   SOCIAL HISTORY: The [place] in a [place] home in [place]. She does not smoke, does not drink. SHE IS A DNR/DNI BUT WANTS TO BE KEPT ALIVE. She is wheelchair-bound.   FAMILY HISTORY: Seizure disorder.   CURRENT MEDICATIONS: Prednisone 10 mg once a day, phenytoin 200 mg every 12 hours, levothyroxine 112 mg once a day, Levaquin 500 mg every 24 hours, furosemide 20 mg 0.5 tablets once a day, Ativan 0.5 mg twice daily.   PHYSICAL EXAMINATION:  VITAL SIGNS: Blood pressure as low as 37/29 at some point. Right now  86/46 and dopamine changed to norepinephrine. Heart rate in between 100 and 120, respiratory rate in between 18 to 30, temperature 98.4, oxygen saturation 93% on 2 L.  GENERAL: Alert and she is oriented x2. Mild distress due to abdominal pain.  HEENT: Pupils are equal and reactive. Extraocular movements are intact. Mucosae are dry. Anicteric sclerae. Pink conjunctivae. No oral lesions. No oropharyngeal exudates.  NECK: Supple. No JVD. No  adenopathy. No carotid bruits.   CARDIOVASCULAR: Regular rate and rhythm, tachycardic. No murmurs, rubs or gallops. No displacement of PMI. No tenderness to palpation anterior chest wall.  LUNGS: Clear without any wheezing or crepitus. No use of accessory muscles.  GENITAL: Negative for external lesions.  ABDOMEN: Slightly distended. Very tender to palpation. Positive rebound. Positive guarding. Significant peritoneal signs. Not surgical candidate. No hepatosplenomegaly.  EXTREMITIES: No edema, cyanosis or clubbing. Pulses +2. Capillary refill less than 3.  SKIN: No rashes or petechiae. No new rashes.  NEUROLOGIC: Cranial nerves II through XII intact. Strength is 4/5 in 4 extremities.  PSYCHIATRIC: Calm, cooperative, very pleasant not agitated.  MUSCULOSKELETAL: No joint effusions or joint swelling.  LYMPHATICS: Negative for in the neck or supraclavicular areas.   DIAGNOSTIC RESULTS: Glucose 83, creatinine 0.78, sodium 130. Other electrolytes within normal limits. Albumin 3. LFTs within normal limits. White count is 9.3, hemoglobin is 13, platelet count 276. Red blood cells 2 in urine. No white blood cells in urine. No signs of infection in the urine.   CT of the abdomen shows stenosis at the origin of the celiac trunk and the stenosis involving the right renal artery. Free fluid on the abdomen and pelvis. Peritoneal thickening and enhancement. Wall thickening at the level of the sigmoid colon. Diverticulitis cannot be excluded. Extensive vascular dz  throughout the abdominal mesentery. Cannot exclude mesenteric edema.   ASSESSMENT AND PLAN: A 81 year old female with severe septic shock and peritonitis.  1.  Septic shock. The patient has very low blood pressures. We are going to continue IV fluids, resuscitation, continue pressor support. At this moment central line has been placed. The patient is going to be sent to the critical care unit. The patient is going to be covered with Zosyn for gastrointestinal  flora and anaerobes.   2.  The patient also is getting blood cultures and a steroid replacement. The patient takes prednisone 10 mg once a day due to her respiratory problems for which we are going to cover with stress steroids, at Solu-Cortef at 50 mg IV q.6 hours.  3.  The patient is high risk of death for cardiovascular arrest, cardiovascular collapse. The patient is not an operatory candidate but definitely she has a source of sepsis intra-abdominal with peritoneal signs. Possibilities of diverticulitis versus mesenteric ischemia since the patient has severe artery sclerosis at the level of the mesenteric arteries.  4.  Chronic respiratory failure. Continue O2. 5.  Chronic obstructive pulmonary disease. At this moment there are no signs of exacerbation. The patient has been taking prednisone. Continue oxygen support and evaluation.  6.  Hypertension. Hold blood pressure medications.  7.  Hypothyroidism. Replace thyroid hormone IV.  8.  Seizure disorder. The patient is not having seizures at this moment. We are going to change her Dilantin to fosphenytoin IV. 9.  Deep vein thrombosis prophylaxis with heparin. Gastrointestinal prophylaxis with Protonix.   The patient is at critical care time. We are going to ask for palliative care to see her.   TOTAL CRITICAL CARE TIME SPENT: I spent  about 60 minutes with this patient without counting for central line placement.    ____________________________ Felipa Furnace, MD rsg:np D: 11/24/2013 18:55:33 ET T: 11/24/2013 19:17:19 ET JOB#: [id number]  cc: Felipa Furnace, MD, <Dictator> Deyvi Bonanno Juanda Chance MD ELECTRONICALLY SIGNED 12/05/2013 23:30

## 2015-02-10 NOTE — Consult Note (Signed)
Brief Consult Note: Diagnosis: septic shock, severe ileus.   Patient was seen by consultant.   Discussed with Attending MD.   Comments: Reviewed XRays - no evidence of perforation. She has developed a severe small bowel and colonic ileus, all the way to the rectum, and she has a solid stool ball there. Abdomen is moderate-severely distended, and although superficially tender, she has no rebound tenderness or guarding. There is nothing surgery can offer her. She lives alone and has no family, but she can speak for herself. I suggest a mostly NPO status (very limited sips of noncarbonated bevs), and conside enemas and dulcolax tabs. Hopefully her severe hypotension didn't lead to ischemic bowel, but if it did, again, surgery would go against her wishes and goals of care, as she would never achieve extubation afterwards. Also, I do not believe further imaging studies will be beneficial, at least not now.  Electronic Signatures: Claude MangesMarterre, Iline Buchinger F (MD)  (Signed 07-Feb-15 15:43)  Authored: Brief Consult Note   Last Updated: 07-Feb-15 15:43 by Claude MangesMarterre, Apryl Brymer F (MD)

## 2015-02-10 NOTE — Discharge Summary (Signed)
PATIENT NAME:  Michelle Kidd, Michelle Kidd MR#:  [id number] DATE OF BIRTH:  October 01, 1920  DATE OF ADMISSION:  11/24/2013 DATE OF DISCHARGE:  11/29/2013  The patient was discharged to hospice home.   DISCHARGE DIAGNOSES: 1.  Severe malnutrition.   2.  Bowel obstruction.  3.  Peritonitis.    CODE STATUS ON DISCHARGE:  DNR.   MEDICATIONS ON DISCHARGE: 1.  Dulcolax suppository 10 mg once a day.  2.  Morphine 0.5 mL oral every 1 to 2 hours.  3.  Zofran 4 mg oral tablet, the patient is getting every six hours as needed for nausea and vomiting.   HISTORY OF PRESENTING ILLNESS:   51.  A 81 year old female with poor historian, came to hospital with abdominal pain going on for a week.  She was not eating well, having regular bowel movements, was urinating a good amount.  She had been feeling weak and dizzy and lightheaded.  She had some pain radiating into her lower back.  At home was on 2 liters oxygen due to chronic respiratory failure.  CT scan of the abdomen showed significant edema of pretibial space.  Blood pressure was in 60s and 50s on admission, with tachycardia.  She was found having possible peritonitis.  A surgery consult was called in, but she was not a surgical patient.  Initially she was admitted to Critical Care Unit with septic shock on Levophed drip slowly tapered off.  We tried laxatives to relieve her constipation and on repeat x-ray was also found having possible questionable perforation, was on empiric Zosyn, maintained nothing by mouth in hospital.  Gradually because of not oral improvement and severe malnutrition she was accepted by hospice home for further management of her care.  2.  Seizure disorder.  We continued Dilantin.  Later on when we put her on oral clear liquid and medication we started her on Dilantin oral.  3.  Chronic respiratory failure secondary to COPD which was stable and we continued oxygen in the hospital.  4.  Hypothyroidism.  TSH was low.  We continued Synthroid.  5.   Leukocytosis which was due to IV steroid due to septic shock.  Gradually it improved.   CONSULTATION IN THE HOSPITAL:  Surgical consult, Dr. Duwaine MaxinWilliam Marterre.   LABORATORY DATA:  Creatinine 0.78, sodium 130, potassium 3.7 on admission.  White cell count 9.3, hemoglobin 13.5, platelet count 276.  Urine culture was negative.  CT scan showed some stenosis with arteries.  Free fluid within abdomen.  Peritoneal thickening.  Blood cultures negative.  White cell count was 18.9.  White cell count went up to 25.3.  X-ray KUB showed small bowel dilatation.   Total time spent in this discharge 35 minutes.   ____________________________ Hope PigeonVaibhavkumar G. Elisabeth PigeonVachhani, MD vgv:ea D: 12-16-13 01:27:18 ET T: 12-16-13 02:45:35 ET JOB#: [id number]  cc: Hope PigeonVaibhavkumar G. Elisabeth PigeonVachhani, MD, <Dictator> Altamese DillingVAIBHAVKUMAR Joven Mom MD ELECTRONICALLY SIGNED 12/10/2013 8:42

## 2015-02-11 NOTE — Consult Note (Signed)
Referring Physician:  Tyrone Schimke   Primary Care Physician:  Shepp[facility] Penton, [address] [place], [place] [address], [phone]  Reason for Consult:  Admit Date: 02-Nov-2011   Chief Complaint: confusion   Reason for Consult: seizure   History of Present Illness:  History of Present Illness:   [name] is an 81 year-old female with a past medical history significant for epilepsy (on dilantin) who was hospitalized on the hospitalist service on 11/02/2011 for confusion, with concern for possible seizure and acute NSTEMI. per ED notes and the admitting H&P, it was ntoed that the patient presented to the ED with confusion and possibly may have had a seizure prior to presentation. The patient reports that she recently saw her PCP, Dr. Arline Asp, who has been managing her seizure medications,and at the time, it was noted that her phenytoin levels were high, so she was told to stop taking her dilantin for a few days and her daily maintenance dose was decreased. The patient doesn't quite recall her daily dose of dilantin, though ED flow sheet indicated that she is currently on 100 mg three times daily (previously had been at 400 mg daily). The patient herself denies that she had any seizures yesterday. She states her last seizure was "a long time ago" though she cannot specify how long ago. She states that she had some chest pain with pain radiating down her left arm, and so she called her aide who came and took her to the hospital. The patient denies that she has any sore muscles today, nor did she bite her tongue or have any incontinence.    ROS:   General: pain    HEENT vision problems    Lungs: no complaints    Cardiac: chest pain    GI: no complaints    GU: no complaints    Musculoskeletal: joint pain  joint swelling    Extremities: no complaints    Skin: no complaints    Neuro: headache    Endocrine: no complaints    Psych: no complaints    Past Medical/Surgical Hx:  epilepsy:   Past Medical/ Surgical Hx:   Past Medical History epilepsy - started in her teens, has been on dilantin and reports that she has not had a seizure in "a long time"    Past Surgical History None   Home Medications:  Additional Home Medications: On Dilantin at home, unclear what the exact dosage is, though ED intake sheet indicates that she had been on dilantin 400 mg daily and this was recently decreased to dilantin 100 mg three times daily.   [facility] Current Meds:  Sodium Chloride 0.9%, 1000 ml at 75 ml/hr  Sodium Chloride 0.9% injection, 2 ml, IV push, q6h  Acetaminophen * tablet, ( Tylenol (325 mg) tablet)  650 mg Oral q4h PRN for pain or temp. greater than 100.4  - Indication: Pain/Fever  Enoxaparin injection, ( Lovenox injection )  40 mg, Subcutaneous, daily  Indication: Prophylaxis or treatment of thromboembolic disorders, Monitor Anticoags per hospital protocol  Ondansetron disintegrating tablet, ( Zofran ODT)  4 mg Oral q6h PRN for nausea, vomiting  - Indication: Nausea/ Vomiting  Pantoprazole tablet, 40 mg Oral q6am  - Indication: Erosive Esophagitis/ GERD  Instructions:  DO NOT CRUSH  Phenytoin Sodium ER capsule,  ( Dilantin)  100 mg Oral q8h  - Indication: Seizures/Status Epilepticus/Neuritic Pain/Venticular Dysrhythmias  LORazepam injection,  ( Ativan injection )  2 mg,  IV push, q2h PRN for seizure  Indication: Anxiety/ Seizure/ Antiemetic Adjunct/ Preop Sedation  Aspirin Enteric Coated tablet, ( Ecotrin)  325 mg Oral daily  - Indication: Pain/Fever/Thromboembolic Disorders/Post MI/Prophylaxis MI  Instructions:  Initiate Bleeding Precautions Protocol--DO NOT CRUSH  Metoprolol tartrate tablet, ( Lopressor)  12.5 mg Oral q12h  - Indication: Antihypertensive/ Angina  Pneumococcal 23-valent Vaccine, 0.5 ml, Intramuscular, atdischarge  Indication: Pneumococcal Immunization, 0.5ml IM once (Stored in Pyxis  Refrigerator)  Fosphenytoin injection, ( CereBYX injection )  1000 mg/pe in Dextrose 5% 100 ml, IV Piggyback, once, Infuse over 10 minute(s)  Indication: Seizures  Allergies:  Allergy Status Unknown:   Allergies:   Allergies NKDA   Social/Family History:  Lives With: alone   Social History: Patient reports she lives by herself. An aide comes to check up on her daily. She denies any tobacco use, alcohol use, or illicit drug use.   Family History: No family history of seizures.   Vital Signs: **Vital Signs.:   13-Jan-13 04:47   Vital Signs Type Admission   Temperature Temperature (F) 99.2   Celsius 37.3   Temperature Source oral   Pulse Pulse 75   Pulse source per Dinamap   Respirations Respirations 18   Systolic BP Systolic BP 104   Diastolic BP (mmHg) Diastolic BP (mmHg) 54   Mean BP 70   BP Source Dinamap   Pulse Ox % Pulse Ox % 93   Pulse Ox Activity Level  At rest   Oxygen Delivery Room Air/ 21 %    08:10   Vital Signs Type Routine   Temperature Temperature (F) 97.6   Celsius 36.4   Temperature Source tympanic   Pulse Pulse 58   Pulse source per Dinamap   Respirations Respirations 18   Systolic BP Systolic BP 93   Diastolic BP (mmHg) Diastolic BP (mmHg) 55   Mean BP 67   BP Source Dinamap   Pulse Ox % Pulse Ox % 93   Pulse Ox Activity Level  At rest   Oxygen Delivery Room Air/ 21 %   Physical Exam:  General: No acute distress   HEENT: Normocephalic, atrumatic; anicteric sclera; moist mucous membranes   Neck: Supple, no carotid bruits   Chest: slight wheezes noted at bilateral bases   Cardiac: regular rate and rhythm, normal S1S2, no murmurs/gallops/rubs   Extremities: Left knee swollen; left foot is plantarflexed and inverted (chronic, per patient)   Neurologic Exam:  Mental Status: Was initially asleep, but awakens easily to exam. Oriented to self, hospital (though did not know she was at [facility]), January 2013. When asked who the President  was, patient responded "Biden" and when I corrected her, she stated that she though I had inquired who the vice-President was. Attention and knowledge intact. Speech fluent and without dysarthria. Repetition intact.   Cranial Nerves: Visual fields intact. PERRL. Extra-ocular movements intact. Facial sensation grossly intact. Face symmetrical. Full shoulder shrug bilaterally. Tingue protrudes midline.   Motor Exam: Normal bulk and tone. No pronator drift. 5/5 bilateral deltoids, triceps, biceps, interosseous, and hand grip.   Right lower extremity: 5/5 iliopsoas, knee extension/flexion, foot dorsi/plantarflexion  Left lower extremity: 4/5 iliopsoas, knee flexion/extension limited by pain due to swollen left knee, 0/5 foot dorsi/plantarflexion (chronic, per patient)   Deep Tendon Reflexes: Symmetric and 1+ throughout. Toes upgoing on right, downgoing on left.   Sensory Exam: Intact to light touch throughout.   Coordination: Rapid alternating movements intact.   Cerebellar Exam: Finger-to-nose intact.     Gait: Gait deferred due to concern that patient may be having acute NSTEMI.   Lab Results: Hepatic:  12-Jan-13 21:19    Bilirubin, Total 0.5   Alkaline Phosphatase 71   SGPT (ALT) 23   SGOT (AST) 35   Total Protein, Serum 8.0   Albumin, Serum 4.1  TDMs:  12-Jan-13 21:19    Dilantin, Serum   3.7  13-Jan-13 05:37    Dilantin, Serum   2.8  Routine Chem:  12-Jan-13 21:19    Glucose, Serum   105   BUN 12   Creatinine (comp) 0.78   Sodium, Serum 141   Potassium, Serum 3.6   Chloride, Serum 103   CO2, Serum 23   Calcium (Total), Serum 8.9   Osmolality (calc) 281   eGFR (African American) >60   eGFR (Non-African American) >60   Anion Gap 15  Cardiac:  12-Jan-13 21:19    CK, Total   616   CPK-MB, Serum   6.0   Troponin I   1.00  13-Jan-13 05:37    CK, Total   653   CPK-MB, Serum   6.3   Troponin I   2.00  Routine UA:  12-Jan-13 20:31    Color (UA) Yellow   Clarity (UA) Clear    Glucose (UA) Negative   Bilirubin (UA) Negative   Ketones (UA) Trace   Specific Gravity (UA) 1.011   Blood (UA) 1+   pH (UA) 5.0   Protein (UA) Negative   Nitrite (UA) Negative   Leukocyte Esterase (UA) Negative   RBC (UA) 4 /HPF   WBC (UA) <1 /HPF   Mucous (UA) PRESENT  Routine Hem:  12-Jan-13 21:19    WBC (CBC) 10.0   RBC (CBC) 4.25   Hemoglobin (CBC) 14.1   Hematocrit (CBC) 41.8   Platelet Count (CBC) 290   MCV 98   MCH 33.1   MCHC 33.7   RDW 12.7   Radiology Results: CT:    12-Jan-13 20:56, CT Head Without Contrast   CT Head Without Contrast    REASON FOR EXAM:    confusion  COMMENTS:       PROCEDURE: CT  - CT HEAD WITHOUT CONTRAST  - Nov 01 2011  8:56PM     RESULT: Comparison:  05/05/2010    Technique: Multiple axial images from the foramen magnum to the vertex   were obtained without IV contrast.    Findings:    There is no evidence for mass effect, midline shift, or extra-axial fluid   collections. There is no evidence for space-occupying lesion,   intracranial hemorrhage, or cortical-based area of infarction. Minimal   periventricular hypoattenuation likely represents sequela of chronic     microangiopathy.    The osseous structures are unremarkable.    IMPRESSION:    No acute intracranial process.          Verified By: ROBERT L. SUBER, M.D., MD   Impression/Recommendations:  Recommendations:   81 year-old female with a past medical history significant for epilepsy (on dilantin) who was hospitalized on the hospitalist service on 11/02/2011 for confusion, with concern for possible seizure and acute NSTEMI.  It remains questionable whether the patient actually had a seizure yesterday, as she denies having any seizure activity yesterday and denies any tongue-biting, incontinence, or sore muscles. Although we cannot entirely rule-out the possibility that her confused mental status overnight may have been due to a seizure, especially with a  subtherapeutic phenytoin level in the context of having   her dilantin dose adjusted recently, it seems more likely that her cardiac issues may have caused some confusion, as the patient does note that she had chest pain radiating to the left arm yesterday, and her troponin levels continue to rise.a subtherapeutic phenytoin level of 2.8 this morning (albumin 4.1), would recommend loading the patient today to keep her phenytoin level between 10.0-20.0. Recommend giving IV fosphenytoin 1000 mg (20 mg/kg) x 1 or give oral phenytoin load as follows: 300 mg, x 1 then after 1-2 hours, give another 300 mg x 1, then after 1-2 hours give 400 mg x 1 (total 1000 mg). Would caution against giving patient an IV phenytoin load, as this can sometimes cause hemodynamic instability (e.g. hypotension).continue current phenytoin maintenance dose of 100 mg Q8H.free and total dilantin level tomorrow morning.brain or routine EEG is unlikely to affect her current management at this time, given that she has a known seizure history that has been relatively stable on her dilantin. communicated with Dr. Firozvi.  Electronic Signatures: Wang, Michael J (MD)  (Signed 13-Jan-13 11:47)  Authored: REFERRING PHYSICIAN, Primary Care Physician, Consult, History of Present Illness, Review of Systems, PAST MEDICAL/SURGICAL HISTORY, HOME MEDICATIONS, Current Medications, ALLERGIES, Social/Family History, NURSING VITAL SIGNS, Physical Exam-, LAB RESULTS, RADIOLOGY RESULTS, Recommendations   Last Updated: 13-Jan-13 11:47 by Wang, Michael J (MD) 

## 2015-02-11 NOTE — Discharge Summary (Signed)
PATIENT NAME:  Michelle Kidd, Michelle Kidd MR#:  [id number] DATE OF BIRTH:  1920/07/14  DATE OF ADMISSION:  11/02/2011 DATE OF DISCHARGE:  11/06/2011  ADMITTING PHYSICIAN: Dr. Alwyn PeaJignesh Patel  DISCHARGING PHYSICIAN:  Dr. Larena GlassmanAmir Urias Sheek PRIMARY CARE PHYSICIAN: Dr. Alonna BucklerAndrew Lamb REFERRING PHYSICIAN:  Dr. Glennie IsleSheryl Gottlieb  ADMITTING DIAGNOSIS: Altered mental status, possible seizure.   DISCHARGE DIAGNOSES:  1. Ultimate status, possible seizure.  2. Non-ST myocardial infarction.  3. Hyperglycemia.  4. Hypertension.  5. Confusion at times.  6. Debilitation.   CONSULTANTS:  1. Dr. Adrian BlackwaterShaukat Khan, cardiology.  2. Case management.  3. Physical therapy.  4. Occupational therapy. 5. Neurology, Dr. Regino SchultzeWang.  TESTS DONE:  1. Chest x-ray 01/12 showed bilateral interstitial opacities secondary to chronic interstitial lung disease, interstitial pulmonary edema or atypical infection not excluded. 2. Left hip 01/12 showed no hip fracture seen. 3. Pelvis AP 01/12 showed mild degenerative changes bilateral hips. 4. CT of the head 10/31/2010 showed no acute intracranial process.  5. Echo Doppler 01/13 by Dr. Julien Nordmannimothy Gollan showed ejection fraction greater than 55%. Left ventricular wall motion is normal. The right ventricular systolic function is normal. The left atrial size is normal. There is mild tricuspid regurgitation. Right ventricular systolic pressure is elevated at 40 to 50 mmHg.   HOSPITAL COURSE: Initial History and Physical were done by Dr. Alwyn PeaJignesh Patel. Please refer to his note dated 11/02/2011 for complete details.  In brief, this is an 81 year old white female with past medical history of seizure disorder who presents with seizures and altered mental status. She was given Ativan, loaded with Dilantin. Her Dilantin level was found to be 3.7. She was started on fosphenytoin.  1. Altered mental status, seizures: I am not sure if she really had one or not.  Dr. Regino SchultzeWang of neurology saw the patient and recommended  to not do MRI or EEG but to load her up with Dilantin. We checked her Dilantin level after loading her with fosphenytoin it became elevated. We held it orally and then resumed her back on 100 mg p.o. t.i.d.  2. Non-ST myocardial infarction. The patient was given aspirin, metoprolol, and Lovenox. She had mild troponin leak. She had morphine and nitroglycerin given to her. She was seen by Dr. Adrian BlackwaterShaukat Khan who recommended no intervention.  3. Hyperglycemia:  We checked her A1c. It was found to be 5.7. LDL was 81. She was discharged on statin because of acute myocardial infarction.  4. Hypertension: This resolved and she was able to start metoprolol.  5. Confusion: We initially used p.r.n. Haldol. Urinalysis showed no signs of infection. This may be possible delirium or Dilantin toxicity.  6. Deep vein thrombosis prophylaxis maintained with Lovenox and aspirin.  7. Debilitation. The patient was recommended for [facility]. We spoke to her nephew Chapman MossLoy Carlyle.  He and the patient were both agreeable to this. Case management coordinated this.  DISCHARGE MEDICATIONS: 1. Dilantin 100 mg p.o. q. 8 hours.  2. Metoprolol 12.5 mg p.o. b.i.d.  3. Aspirin 325 mg daily.  4. Zocor 20 mg daily.  5. Nitroglycerin syrup 0.4 mg sublingual q. 5 minutes p.r.n. chest pain times three.   DIET: Regular diet.   ACTIVITY: As tolerated. Physical therapy evaluation.  FOLLOWUP: She should follow up with Dr. Alonna BucklerAndrew Lamb in one week. She should get  Dilantin level checked in three days.  CODE STATUS: FULL CODE.  Thank you for allowing me to participate in the care of this patient.  TOTAL  TIME SPENT ON DISCHARGE: 50 minutes.    ____________________________ Corie Chiquito Lafayette Dragon, MD aaf:bjt D: 11/06/2011 10:47:18 ET T: 11/06/2011 11:20:14 ET JOB#: [id number]  cc: Karolee Ohs A. Lafayette Dragon, MD, <Dictator> Laurier Nancy, MD Reola Mosher. Randa Lynn, MD Nolon Bussing. Regino Schultze, MD Karolee Ohs Laverda Page MD ELECTRONICALLY SIGNED  11/07/2011 14:57

## 2015-02-11 NOTE — Consult Note (Signed)
PATIENT NAME:  Michelle Kidd, Kseniya M MR#:  [id number] DATE OF BIRTH:  08/01/1920  DATE OF CONSULTATION:  11/02/2011  REFERRING PHYSICIAN:   CONSULTING PHYSICIAN:  Laurier NancyShaukat A. Azarius Lambson, MD  INDICATION FOR CONSULTATION: Elevated troponin.  HISTORY OF PRESENT ILLNESS: This is an 81 year old white female with a past medical history of seizure disorder who came in with altered mental status and supposedly had another episode of seizure. She had elevated an troponin, thus I was asked to evaluate the patient. CT scan of the brain was apparently negative and her initial troponin was 1.0 and then it went up to 2.2, thus I was asked to evaluate the patient She appears to be lying comfortably, sleeping. She is not able to give much history but apparently when she was awake she denied any chest pain or shortness of breath.   PAST MEDICAL HISTORY: History of seizure disorder.   ALLERGIES: No known drug allergies.   HOME MEDICATIONS: Essentially unknown, except for Dilantin.   SOCIAL HISTORY: She denies EtOH abuse or smoking.   FAMILY HISTORY: Unremarkable.   PHYSICAL EXAMINATION:   GENERAL: She is alert and oriented x0 right now.  VITAL SIGNS: Temperature is 97.6, pulse 58, respirations 18, blood pressure 93/55, and saturation is 93.   NECK: No JVD.   LUNGS: Clear.   HEART: Regular rate and rhythm. Normal S1 and S2. No audible murmur.   ABDOMEN: Soft and nontender, positive bowel sounds.   EXTREMITIES: No pedal edema.   DIAGNOSTIC DATA: EKG shows normal sinus rhythm at 67 beats per minute, nonspecific ST-T changes.   ASSESSMENT AND PLAN: The patient has mildly elevated troponins most likely secondary to seizure with elevated CPK with it. This is more like rhabdomyolysis. Her EKG just has nonspecific changes. I do not feel that this is related to non- STEMI. Because of the seizure that the patient had, this is most related to rhabdomyolysis. No further cardiac work-up is necessary. Thank you very  much for the referral. ____________________________ Laurier NancyShaukat A. Jovita Persing, MD sak:slb D: 11/02/2011 12:43:04 ET T: 11/02/2011 13:32:14 ET JOB#: [id number]  cc: Laurier NancyShaukat A. Robbie Nangle, MD, <Dictator> Laurier NancySHAUKAT A Renea Schoonmaker MD ELECTRONICALLY SIGNED 11/18/2011 9:01

## 2015-02-11 NOTE — H&P (Signed)
PATIENT NAME:  Michelle Kidd, Michelle Kidd MR#:  [id number] DATE OF BIRTH:  06/03/1920  DATE OF ADMISSION:  11/02/2011  PRIMARY CARE PHYSICIAN: Andrew Lamb, MD  EMERGENCY ROOM PHYSICIAN: Sheryl Gottlieb, MD  CHIEF COMPLAINT: Altered mental status, seizure.  HISTORY OF PRESENT ILLNESS: The patient is an 81-year-old female with history of seizure disorder who presents with the chief complaint of multiple seizure episodes at home. Per the caregiver, the patient's Dilantin was stopped over this weekend. The patient has been confused. The patient is a very poor historian. In the emergency room, her Dilantin level was 3.7. The patient also is agitated. In the emergency room she pulled out her IV. She received Ativan IM. CT of the brain is negative.   PAST MEDICAL HISTORY: Seizure disorder.   ALLERGIES: No known drug allergies.   HOME MEDICATIONS: The patient takes Dilantin, dose is unknown.   SOCIAL HISTORY: No history of tobacco abuse, alcohol abuse, or drug abuse. The patient lives alone. She has a caregiver at home.   FAMILY HISTORY: The patient is unable to provide.  REVIEW OF SYSTEMS: CONSTITUTIONAL: Review of systems is limited due to the patient's agitation. No reports of fevers, chills, or night sweats. HEENT: No reports of hearing loss, dysphagia, or visual problems. CARDIOVASCULAR: No chest pain, orthopnea, or PND. RESPIRATORY: No cough, wheezing, or shortness of breath. GASTROINTESTINAL: No nausea, vomiting, abdominal pain, hematemesis, hematochezia, or melena. GU: No hematuria, dysuria, or frequency. NEUROLOGIC: No headache, focal weakness, or seizures. SKIN: No lesions or rash. ENDOCRINE: No polyuria, polyphagia, or polydipsia. MUSCULOSKELETAL: No arthritis, joint effusion, swelling, or ecchymosis. HEMATOLOGIC: No ecchymosis, no bleeding, and no petechiae noted.   PHYSICAL EXAMINATION:   VITAL SIGNS: Temperature 98.2, heart rate 84, respiratory rate 22, blood pressure 127/67, and oxygen  saturation 95%.   HEENT: Atraumatic, normocephalic. Pupils are equal, round, and reactive to light and intact. Extraocular movements are intact. Sclerae are anicteric. Mucous membranes are dry.   NECK: Supple. No organomegaly.   HEART: S1 and S2 regular rate and rhythm. No gallops. No thrills. No murmurs.   LUNGS: Clear to auscultation. No rales, no rhonchi, no wheezes, and no bronchial breath sounds.   GI: Abdomen is soft, nontender, and nondistended. Normal bowel sounds. No hepatosplenomegaly.   GU: There is no hematuria or masses noted.   SKIN: No lesions. No rash.   ENDOCRINE: No masses or thyromegaly.   LYMPH: No lymphadenopathy or nodes palpable.   NEUROLOGIC: Cranial nerves II through XII are grossly intact. Motor strength is 5 out of 5 in bilateral upper and lower extremities. Sensation is within normal limits. No focal neurological deficits are noted on examination.   MUSCULOSKELETAL: No arthritis, joint effusion, or swelling.   HEMATOLOGIC: No ecchymosis, no bleeding, and no petechiae.   EXTREMITIES: No cyanosis, no clubbing, and no edema.   PSYCH: The patient is agitated. Examination is limited.   LABS/STUDIES: CT scan of the brain is negative.   Urinalysis is negative.   Total CK 616 and CK-MB 6.0. Glucose 105, BUN 12, creatinine 0.78, sodium 141, potassium 3.6, chloride 103, CO2 23, calcium 8.9, total bilirubin 0.5, alkaline phosphatase 71, ALT 23, AST 35, total protein 8.0, and albumin 4.1. EGFR greater than 60. WBC count 10,000, hemoglobin 14.1, hematocrit 41.8, and platelet count 290. Troponin is 1. Dilantin is 3.7.   ASSESSMENT AND PLAN:  1. The patient is an 81-year-old female who presents with the chief complaint of confusion and seizure. The patient has a history of seizure   disorder. Her Dilantin level is subtherapeutic. We will start the patient on p.r.n. Ativan and continue Dilantin p.o. We will check MRI of the brain and EEG. Neurology consultation.   2. Acute NSTEMI: Start aspirin and metoprolol. Check serial cardiac enzymes, troponin and Echo. Cardiology consultation.  ____________________________ Jignesh S. Patel, MD jsp:slb D: 11/02/2011 02:01:07 ET T: 11/02/2011 08:47:23 ET JOB#: [id number]  cc: Jignesh S. Patel, MD, <Dictator> Andrew S. Lamb, MD JIGNESH S PATEL MD ELECTRONICALLY SIGNED 11/02/2011 22:08
